# Patient Record
Sex: Female | Born: 1960 | Race: White | Hispanic: No | Marital: Single | Smoking: Former smoker
Health system: Southern US, Community
[De-identification: ages and names within clinical notes are randomized; demographics above are authoritative.]

## PROBLEM LIST (undated history)

## (undated) DIAGNOSIS — S069X9A Unspecified intracranial injury with loss of consciousness of unspecified duration, initial encounter: Secondary | ICD-10-CM

## (undated) DIAGNOSIS — F32A Depression, unspecified: Secondary | ICD-10-CM

## (undated) DIAGNOSIS — R87619 Unspecified abnormal cytological findings in specimens from cervix uteri: Secondary | ICD-10-CM

## (undated) DIAGNOSIS — E079 Disorder of thyroid, unspecified: Secondary | ICD-10-CM

## (undated) DIAGNOSIS — F329 Major depressive disorder, single episode, unspecified: Secondary | ICD-10-CM

## (undated) HISTORY — DX: Depression, unspecified: F32.A

## (undated) HISTORY — DX: Disorder of thyroid, unspecified: E07.9

## (undated) HISTORY — PX: DILATION AND CURETTAGE OF UTERUS: SHX78

## (undated) HISTORY — PX: HERNIA REPAIR: SHX51

## (undated) HISTORY — PX: AUGMENTATION MAMMAPLASTY: SUR837

## (undated) HISTORY — PX: CERVICAL BIOPSY  W/ LOOP ELECTRODE EXCISION: SUR135

## (undated) HISTORY — DX: Unspecified intracranial injury with loss of consciousness of unspecified duration, initial encounter: S06.9X9A

## (undated) HISTORY — DX: Unspecified abnormal cytological findings in specimens from cervix uteri: R87.619

## (undated) HISTORY — DX: Major depressive disorder, single episode, unspecified: F32.9

---

## 2003-05-11 ENCOUNTER — Other Ambulatory Visit: Admission: RE | Admit: 2003-05-11 | Discharge: 2003-05-11 | Payer: Self-pay | Admitting: Obstetrics and Gynecology

## 2003-06-08 ENCOUNTER — Other Ambulatory Visit: Admission: RE | Admit: 2003-06-08 | Discharge: 2003-06-08 | Payer: Self-pay | Admitting: Obstetrics and Gynecology

## 2003-10-21 HISTORY — PX: CERVICAL BIOPSY  W/ LOOP ELECTRODE EXCISION: SUR135

## 2003-10-25 ENCOUNTER — Other Ambulatory Visit: Admission: RE | Admit: 2003-10-25 | Discharge: 2003-10-25 | Payer: Self-pay | Admitting: Obstetrics and Gynecology

## 2004-02-07 ENCOUNTER — Other Ambulatory Visit: Admission: RE | Admit: 2004-02-07 | Discharge: 2004-02-07 | Payer: Self-pay | Admitting: *Deleted

## 2004-05-23 ENCOUNTER — Other Ambulatory Visit: Admission: RE | Admit: 2004-05-23 | Discharge: 2004-05-23 | Payer: Self-pay | Admitting: Obstetrics and Gynecology

## 2004-12-09 ENCOUNTER — Other Ambulatory Visit: Admission: RE | Admit: 2004-12-09 | Discharge: 2004-12-09 | Payer: Self-pay | Admitting: Obstetrics and Gynecology

## 2005-07-04 ENCOUNTER — Other Ambulatory Visit: Admission: RE | Admit: 2005-07-04 | Discharge: 2005-07-04 | Payer: Self-pay | Admitting: *Deleted

## 2005-07-31 ENCOUNTER — Other Ambulatory Visit: Admission: RE | Admit: 2005-07-31 | Discharge: 2005-07-31 | Payer: Self-pay | Admitting: Obstetrics and Gynecology

## 2005-10-20 DIAGNOSIS — S069XAA Unspecified intracranial injury with loss of consciousness status unknown, initial encounter: Secondary | ICD-10-CM

## 2005-10-20 DIAGNOSIS — S069X9A Unspecified intracranial injury with loss of consciousness of unspecified duration, initial encounter: Secondary | ICD-10-CM

## 2005-10-20 HISTORY — DX: Unspecified intracranial injury with loss of consciousness of unspecified duration, initial encounter: S06.9X9A

## 2005-10-20 HISTORY — DX: Unspecified intracranial injury with loss of consciousness status unknown, initial encounter: S06.9XAA

## 2006-03-25 ENCOUNTER — Ambulatory Visit: Payer: Self-pay | Admitting: Pulmonary Disease

## 2006-03-25 ENCOUNTER — Inpatient Hospital Stay (HOSPITAL_COMMUNITY): Admission: EM | Admit: 2006-03-25 | Discharge: 2006-04-08 | Payer: Self-pay | Admitting: Emergency Medicine

## 2006-04-08 ENCOUNTER — Inpatient Hospital Stay (HOSPITAL_COMMUNITY): Admission: AD | Admit: 2006-04-08 | Discharge: 2006-04-11 | Payer: Self-pay | Admitting: Psychiatry

## 2006-04-09 ENCOUNTER — Ambulatory Visit: Payer: Self-pay | Admitting: Psychiatry

## 2006-04-28 ENCOUNTER — Encounter: Admission: RE | Admit: 2006-04-28 | Discharge: 2006-07-27 | Payer: Self-pay | Admitting: Internal Medicine

## 2006-08-28 ENCOUNTER — Other Ambulatory Visit: Admission: RE | Admit: 2006-08-28 | Discharge: 2006-08-28 | Payer: Self-pay | Admitting: Obstetrics & Gynecology

## 2006-09-21 IMAGING — CT CT HEAD W/O CM
1 of 2 series · 13 of 30 positions shown, 17 images · non-contrast
Comparison: None.

CLINICAL DATA: 44 year old female with respiratory failure.
 HEAD CT WITHOUT CONTRAST ? 03/26/06:
TECHNIQUE: Contiguous axial CT images were obtained from the base of the skull through the vertex according to standard protocol without contrast.

[Series 2: brain · axial · 0.49mm/px · z∈[+101,+219]mm · 13 of 28 slices shown, 17 images]
[im 2/28  brain]
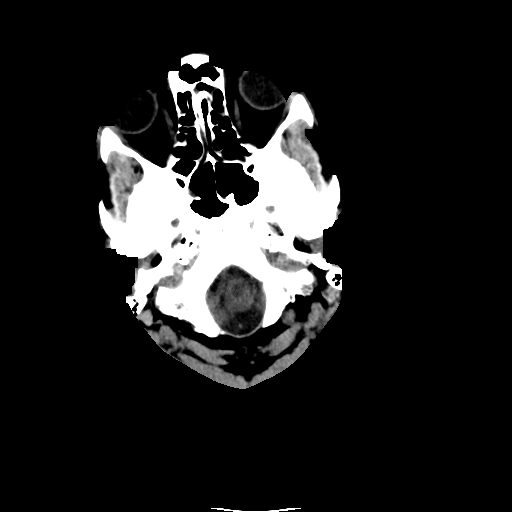
[im 2/28  bone]
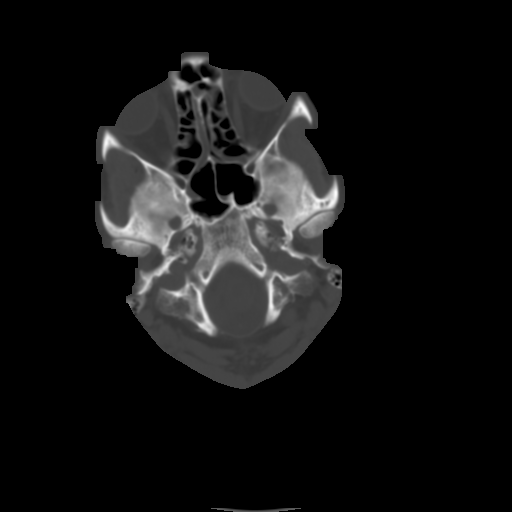
[im 4/28  brain]
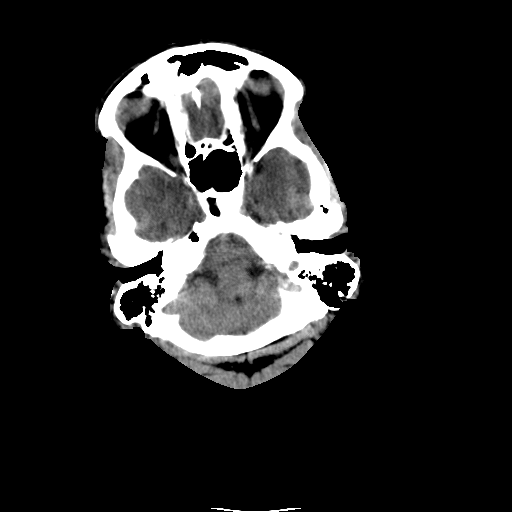
[im 6/28  brain]
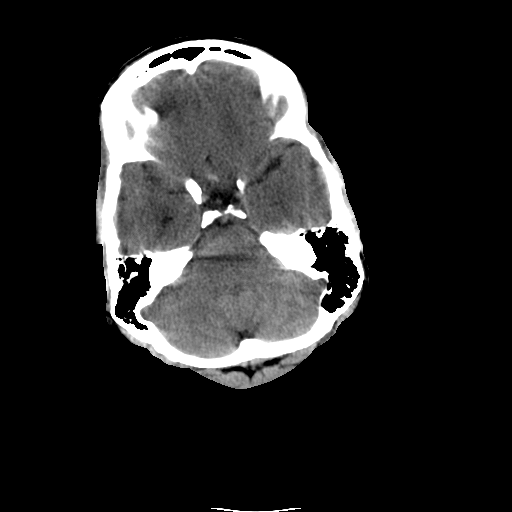
[im 8/28  brain]
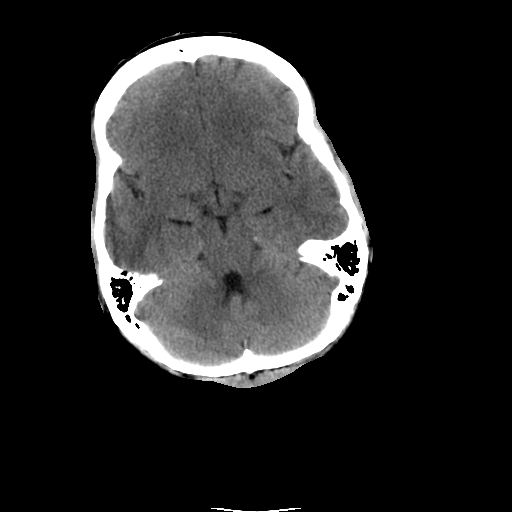
[im 10/28  brain]
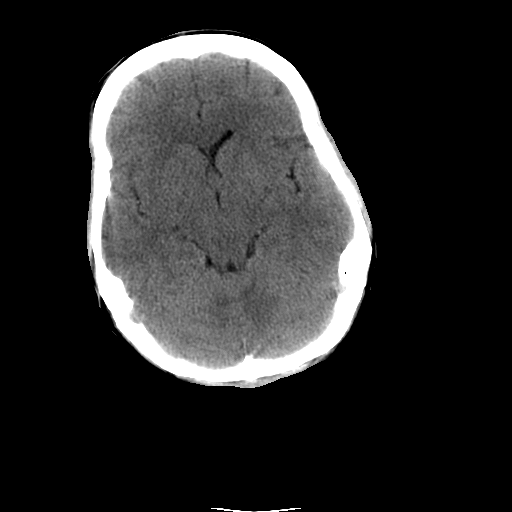
[im 10/28  bone]
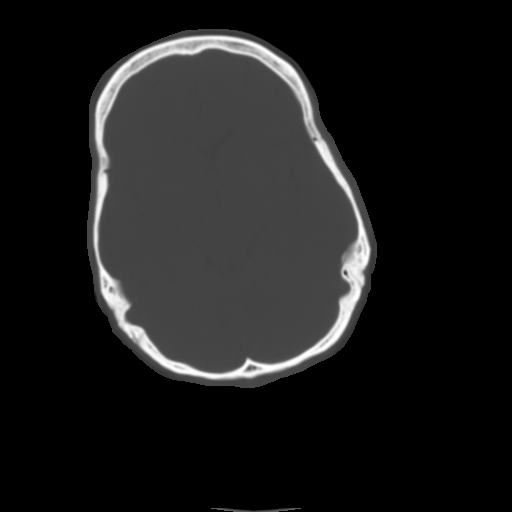
[im 12/28  brain]
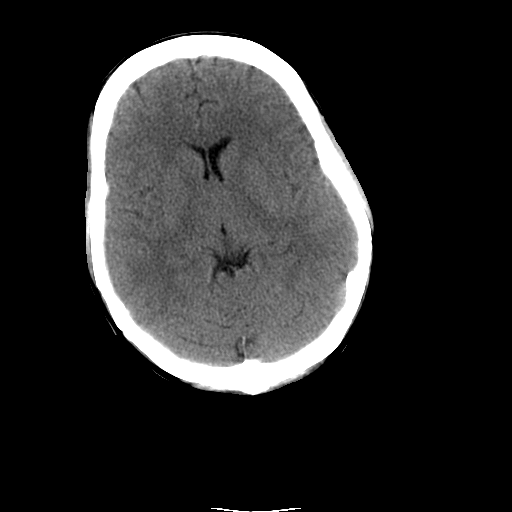
[im 14/28  brain]
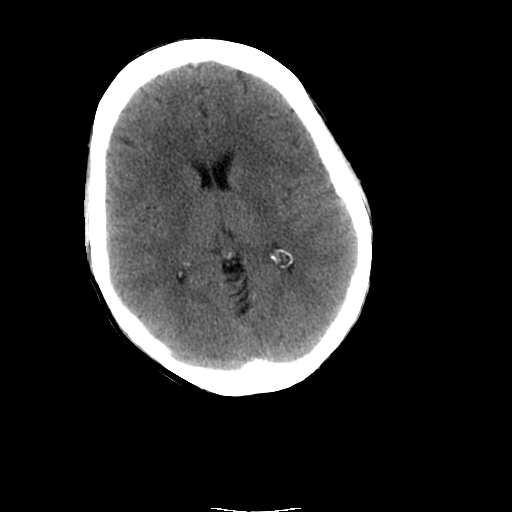
[im 16/28  brain]
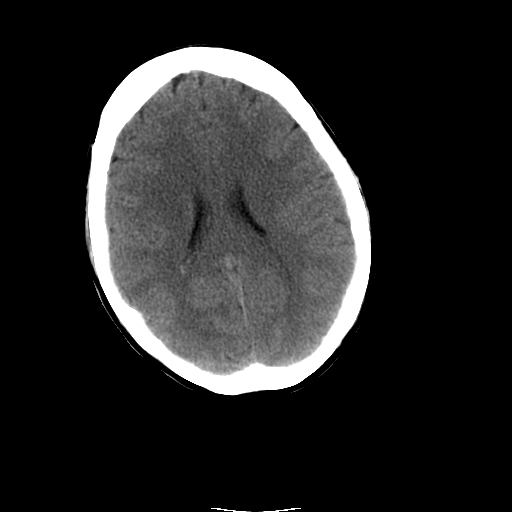
[im 18/28  brain]
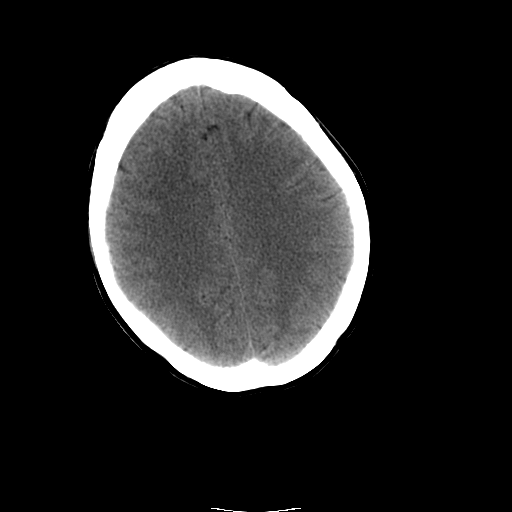
[im 18/28  bone]
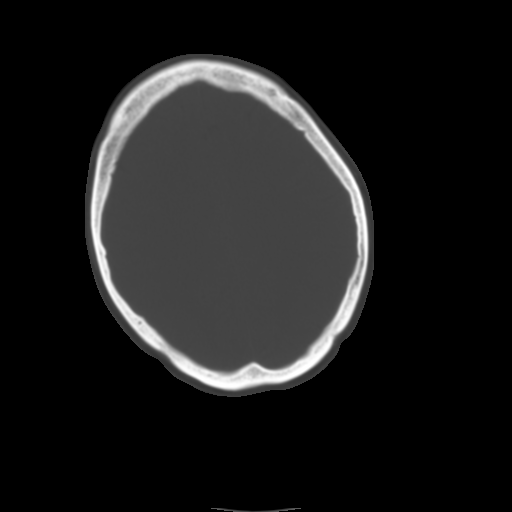
[im 20/28  brain]
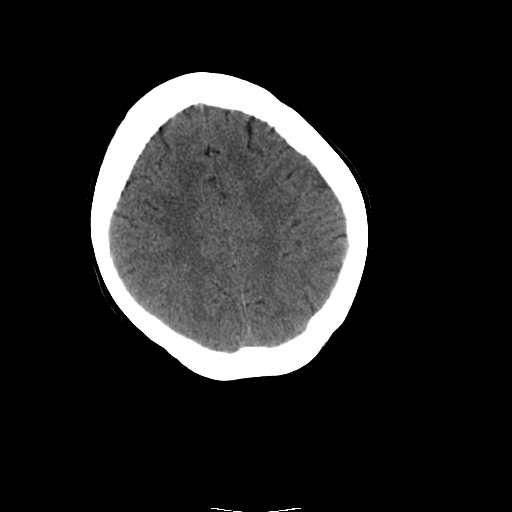
[im 22/28  brain]
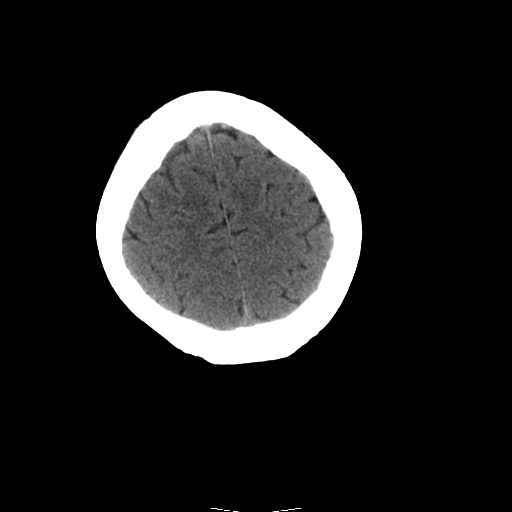
[im 24/28  brain]
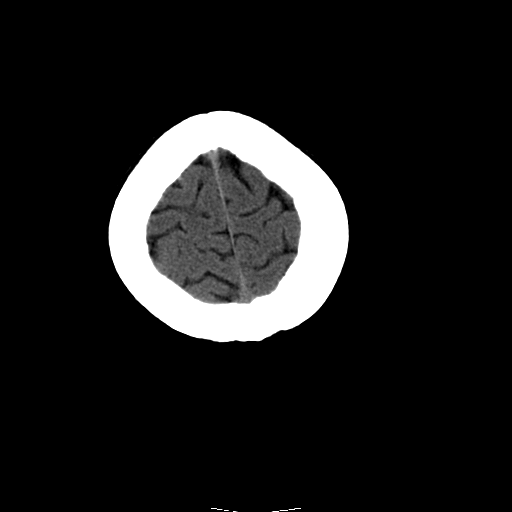
[im 26/28  brain]
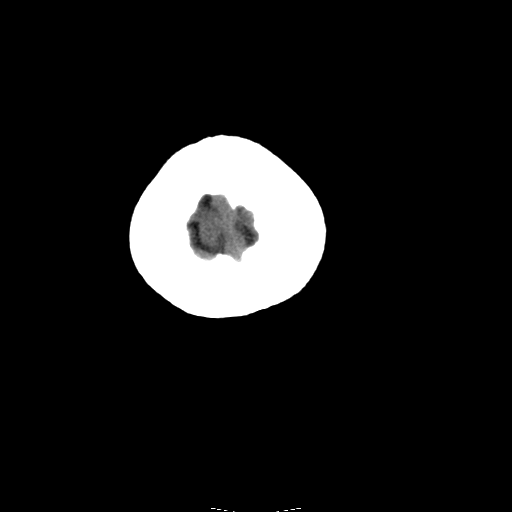
[im 26/28  bone]
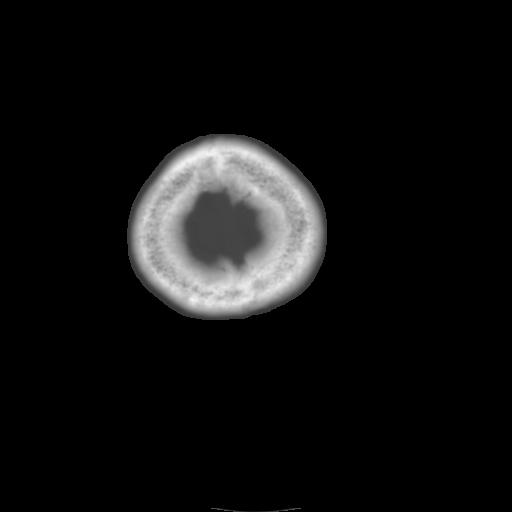

[13 of 30 positions shown; findings below may reference images not displayed]

FINDINGS: No acute intracranial abnormality is present.  Specifically, there is no evidence for acute infarct, hemorrhage, mass, hydrocephalus, or extra-axial fluid collection.  A small air-fluid level is noted within the left sphenoid sinus.  There is mucosal thickening throughout scattered ethmoid air cells.  There is an osteoma within the inferior aspect of the right frontal sinus.  The maxillary sinuses are not well visualized.  The mastoid air cells are clear.  The osseous skull is intact.
IMPRESSION: 1.  No acute intracranial abnormality. 
 2.  Minimal sinus disease.

## 2006-10-02 IMAGING — CR DG CHEST 1V PORT
1 series · 1 of 1 positions shown · non-contrast
Comparison: 04/02/06.
 PORTABLE CHEST - 1 VIEW:

CLINICAL DATA: Respiratory failure.  Overdose.

[view not recorded]
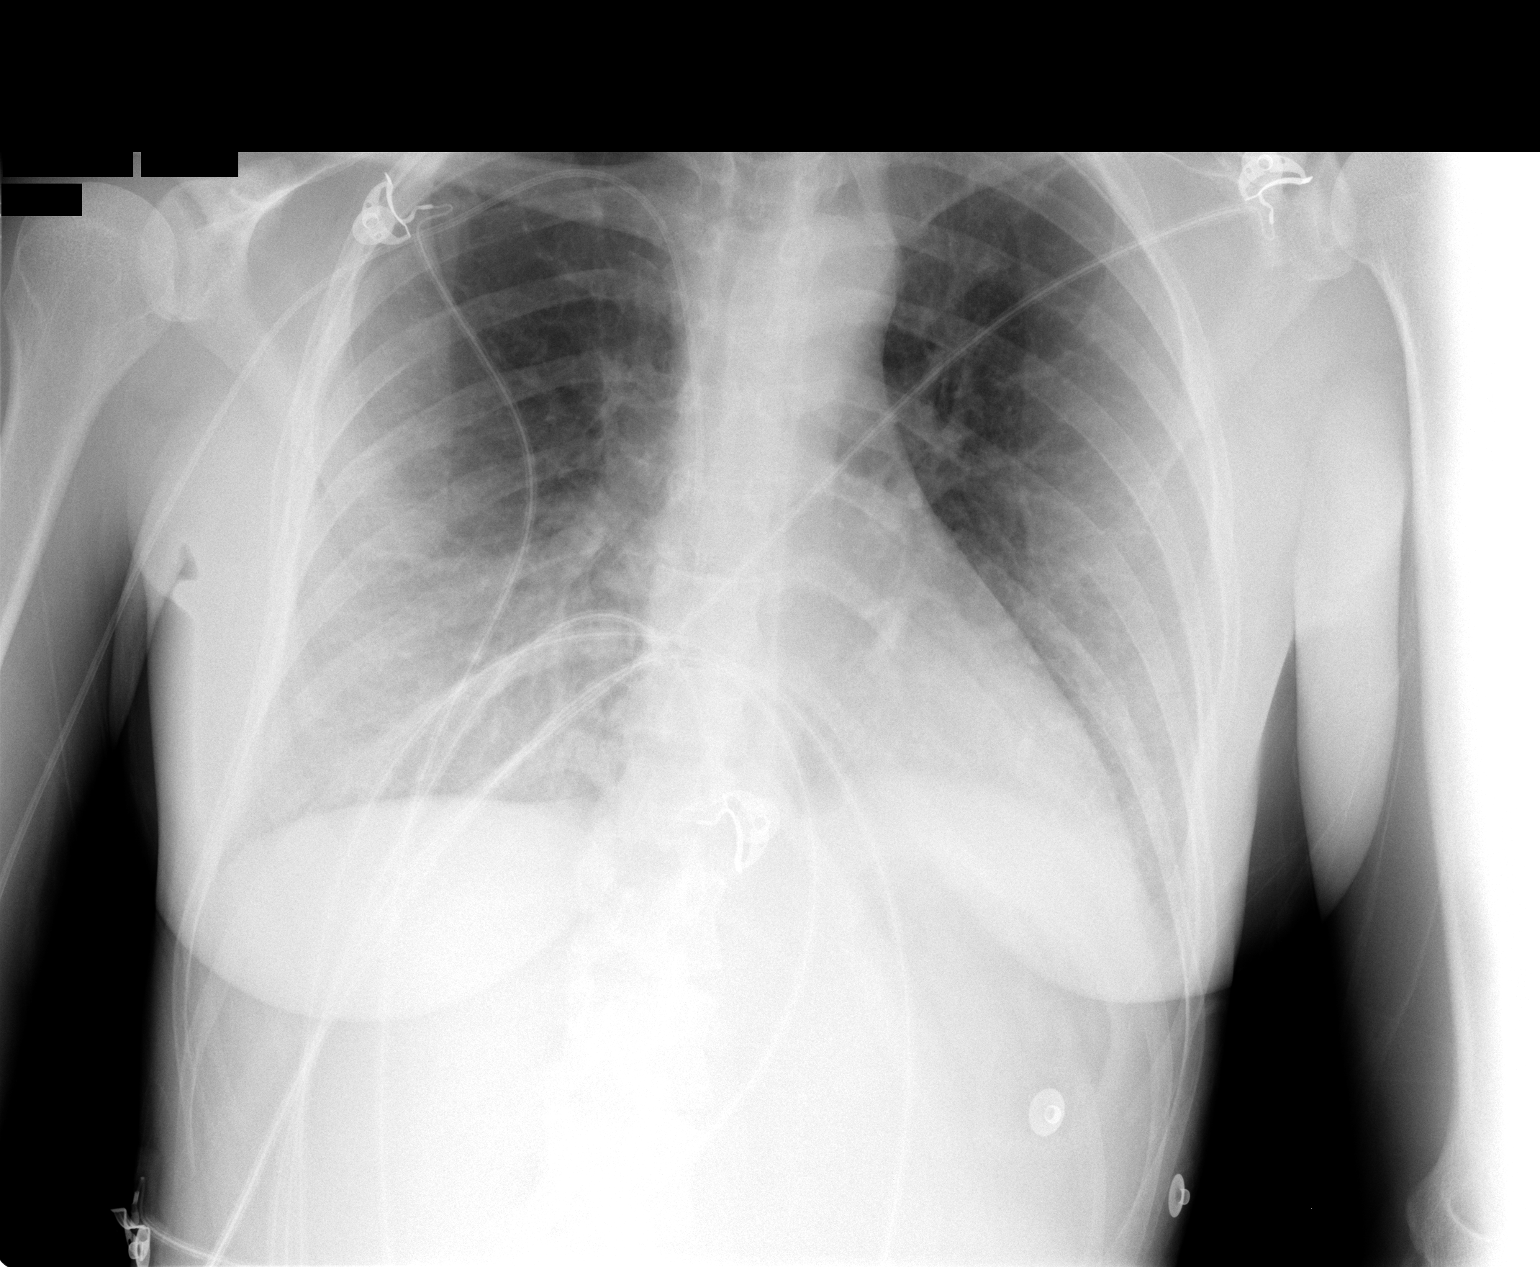

[1 of 1 positions shown; findings below may reference images not displayed]

FINDINGS: Trachea midline.  Heart size is within normal limits.  There has been interval improvement in pulmonary edema.  Left lower lobe atelectasis is improving as well.  Right PICC terminates in the SVC.
IMPRESSION: Improving pulmonary edema and left lower lobe atelectasis.

## 2007-09-09 ENCOUNTER — Other Ambulatory Visit: Admission: RE | Admit: 2007-09-09 | Discharge: 2007-09-09 | Payer: Self-pay | Admitting: Obstetrics & Gynecology

## 2008-09-04 ENCOUNTER — Encounter
Admission: RE | Admit: 2008-09-04 | Discharge: 2008-10-03 | Payer: Self-pay | Admitting: Physical Medicine & Rehabilitation

## 2008-09-05 ENCOUNTER — Ambulatory Visit: Payer: Self-pay | Admitting: Physical Medicine & Rehabilitation

## 2008-09-18 ENCOUNTER — Other Ambulatory Visit: Admission: RE | Admit: 2008-09-18 | Discharge: 2008-09-18 | Payer: Self-pay | Admitting: Obstetrics & Gynecology

## 2008-10-03 ENCOUNTER — Ambulatory Visit: Payer: Self-pay | Admitting: Physical Medicine & Rehabilitation

## 2008-10-05 ENCOUNTER — Encounter
Admission: RE | Admit: 2008-10-05 | Discharge: 2008-10-19 | Payer: Self-pay | Admitting: Physical Medicine & Rehabilitation

## 2009-01-10 ENCOUNTER — Encounter
Admission: RE | Admit: 2009-01-10 | Discharge: 2009-01-18 | Payer: Self-pay | Admitting: Physical Medicine & Rehabilitation

## 2009-01-12 ENCOUNTER — Ambulatory Visit: Payer: Self-pay | Admitting: Physical Medicine & Rehabilitation

## 2010-11-10 ENCOUNTER — Encounter: Payer: Self-pay | Admitting: Obstetrics and Gynecology

## 2011-03-04 NOTE — Assessment & Plan Note (Signed)
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  We initiated trial  of low-dose Ritalin at 2.5 daily at 7 o'clock and 12 noon.  She felt  that the Ritalin was very helpful with her arousal and attention, but  she had problem sleeping.  She never tried this to an a.m. dose.  She  has speech therapy scheduled to start this week.  [facility] went [facility] week and spoke with the speech therapist as  well there.  She denies pain today.  No other neurological changes are  noted.   REVIEW OF SYSTEMS:  As noted above.  Full review is written out in the  history section in the chart.   SOCIAL HISTORY:  The patient is single, living with her dog.  She has a  court case pending over supervision issues regarding her children who  are 27 and 15.   PHYSICAL EXAMINATION:  VITAL SIGNS:  Blood pressure is 120/64, pulse is  78, and respiratory rate 18.  She is saturating 100% on room air.  GENERAL:  The patient is pleasant, alert, and oriented x3.  Affect is  bright and appropriate.  The speech remains nasal but overall, she is  intelligible.  Her strength is 5/5 with normal reflexes and sensory  function.  She can be a bit anxious at times but overall, is  appropriate.  Insight and awareness appear reasonable.  She had good  executive functioning still.  Cranial nerve exam is nonfocal.  HEART:  Regular.  CHEST:  Clear.  ABDOMEN:  Soft, nontender.   ASSESSMENT:  1. Toxic encephalopathy.  2. History of depression/suicide attempts.   PLAN:  1. The patient to continue Namenda per Dr. Sandria Manly.  2. We will give her a trial of Aricept 5 mg at bedtime to improve      memory.  She would like to give this a trial.  It had a lot of      success in memory with brain injury population.  So I think it is      worth a shot.  3. We will retrial Ritalin at 2.5 mg in the morning only.  This should      not cause any nighttime hyperarousal.  4. Speech followup is arranged and pending.  5.  I will see her back in 2 months' time.      Ranelle Oyster, M.D.  Electronically Signed     ZTS/MedQ  D:  10/03/2008 10:50:36  T:  10/04/2008 05:07:56  Job #:  [id number]   cc:   Evie Lacks, MD  Fax: [phone]

## 2011-03-04 NOTE — Assessment & Plan Note (Signed)
This is a brief visit for [name] regarding her toxic  encephalopathy.  She is not taking Aricept or Ritalin anymore at this  point.  She wants to stay clear of medications and stay more natural  with her remedies.  She has had no further issues since I last saw her.  She denies pain.  She is sleeping well.  She has been through some  speech therapy and has been active in the [facility].   REVIEW OF SYSTEMS:  Notable for the above.  Full review is in the  written health and history section of the chart.   SOCIAL HISTORY:  The patient lives alone and is divorced.   PHYSICAL EXAMINATION:  Blood pressure is 111/71, pulse is 66,  respiratory rate 18.  She is sating 100% on room air.  The patient is  generally pleasant, alert and oriented x3.  Gait is stable.  Speech is  intelligible.  Strength is 5/5.  Heart is regular rate.  Chest is clear.   ASSESSMENT:  1. Toxic encephalopathy.  2. History of depression/suicide attempts.   PLAN:  I will discharge the patient from this clinic as I have nothing  really new to offer.  Overall, she is doing quite well considering the  depth and level of her injury.  She can come back to see me at anytime  if she should choose for followup.  She will see Dr. Sandria Manly as needed as  well.  Encouraged involvement [facility] which she seems  to have taken to.      Ranelle Oyster, M.D.  Electronically Signed     ZTS/MedQ  D:  01/12/2009 09:39:37  T:  01/12/2009 23:43:36  Job #:  [id number]   cc:   Genene Churn. Love, M.D.  Fax: [phone]

## 2011-03-07 NOTE — Consult Note (Signed)
[name], [name]             ACCOUNT NO.:  [id number]   MEDICAL RECORD NO.:  [id number]          PATIENT TYPE:  INP   LOCATION:  6729                         FACILITY:  [facility]   PHYSICIAN:  Antonietta Breach, M.D.  DATE OF BIRTH:  July 02, 1961   DATE OF CONSULTATION:  04/02/2006  DATE OF DISCHARGE:  04/08/2006                                   CONSULTATION   PSYCHIATRY CONSULTATION ADDENDUM   DATE OF CONSULTATION:  April 02, 2006.   REASON FOR CONSULTATION:  Severe mood disturbance and possible serotonin  syndrome.   PHYSICAL EXAMINATION:  MENTAL STATUS EXAMINATION:  [name] is lying  supine in her hospital bed.  Her affect is flat.  Her thought process is  disorganized.  She is clearly confused and disoriented.  She does not appear  to have intact judgment or insight.  She is stuporous with poor  concentration.      Antonietta Breach, M.D.  Electronically Signed     JW/MEDQ  D:  04/09/2006  T:  04/09/2006  Job:  [id number]

## 2011-03-07 NOTE — Discharge Summary (Signed)
[name], [name]             ACCOUNT NO.:  [id number]   MEDICAL RECORD NO.:  [id number]          PATIENT TYPE:  IPS   LOCATION:  0305                          FACILITY[facility]  [facility]   PHYSICIAN:  Geoffery Lyons, M.D.      DATE OF BIRTH:  11-19-1960   DATE OF ADMISSION:  04/08/2006  DATE OF DISCHARGE:  04/11/2006                                 DISCHARGE SUMMARY   CHIEF COMPLAINT AND PRESENTING ILLNESS:  This was the first admission to  [facility]  for this 50 year old divorced white female who  was voluntarily admitted on June 20 when she was transferred from [facility]  medical unit.  She had overdosed on several medications requiring intubation  twice.  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  She was treated accordingly.  She  recovered from the overdose, transferred to [facility] to further treat her depression as well as her alcohol abuse,  possibly dependence.  She endorsed that she had been under a lot stress,  broke up with a boyfriend, ex-husband was giving her a hard time in terms of  taking full custody of the children.  To make things worse, brother died  secondary to AIDS.  Endorsed that she could not take this anymore.  She has  been increasingly drinking more alcohol, was drinking a bottle of wine,  maybe more, per day.  On the day of the overdose she was already  intoxicated, felt completely overwhelmed, sense of hopelessness,  helplessness, and she started taking medication, eventually took a full  overdose.  Her son walked into the house when she was toxic.   PAST PSYCHIATRIC HISTORY:  First time at [facility], no  present treatment.  Being treated with Paxil for depression.   PAST MEDICAL HISTORY:  As already stated, status post overdose requiring  intubation.  Hypothyroidism.   ALCOHOL AND DRUG HISTORY:  Did admit to increased use of alcohol, up to a  bottle of wine per day.   MEDICATIONS:  1.  Synthroid 200 mcg per day.  2.  Paxil has been taking for years.   PHYSICAL EXAMINATION:  Performed at the medical unit.  Upon re-evaluation  she was experiencing some difficulty in enunciation, some dysarthria.  Otherwise physical examination was within normal limits.   MENTAL STATUS EXAM:  Reveals an alert, cooperative female, fair eye contact.  Speech: Some dysarthria but normal rate, tempo and production.  Mood  depressed, affect depressed.  Thought processes logical coherent and  relevant, starting to remember some of the events around the overdose and  not completely aware of the severity of what she went through.  Some  confusion in terms of particular times and dates and sequence of events, but  no evidence of delusions or hallucinations. Cognition:  Hard time focusing,  some difficulty with recent memory, some difficulty remembering specifics  around and after the overdose.  No suicidal or homicidal ideas, no  hallucinations.   ADMISSION DIAGNOSES:  AXIS I:  Major depression, recurrent, alcohol abuse,  rule out dependence.  AXIS II:  No diagnosis.  AXIS III:  Hypothyroidism, status post poly pharmacy overdose, UTI serotonin  syndrome.  AXIS IV:  Moderate.  AXIS V:  Upon admission 45, highest global assessment of functioning in the  last year 75-80.   COURSE IN HOSPITAL:  She was admitted and started in individual and group  psychotherapy.  She was maintained on the Synthroid.  She was also placed on  Protonix 40 mg per day and we provided some Librium as needed for  possibility of any withdrawal.  Lab work on June 19: Urinalysis leukocytes  many, nitrate moderate, white blood cells 11-20.  June 17 white blood cells  12.7, hemoglobin 11.6.  Blood chemistries: Sodium 137, potassium 3.7,  glucose 108.  Liver enzymes: SGOT 26, SGPT 59.  As otherwise stated,  she  endorsed that she was under a lot of stress with all the events already  identified, did admit to depending more on alcohol to cope.  She seemed to  be minimizing the stressors, the events, her use.  Endorsed that she wanted  to get out of the hospital and move on with her life.  Initially some  ambivalence as far as her being alive, but eventually she endorsed that she  was happy and she was given another chance.  Did endorse a long history of  depression, on Paxil for years, claiming not responding as well lately.  We  continued to work to stabilize.  As the hospitalization progressed she  seemed to be more aware of the events that had been before she was admitted.  No evidence of the confusion that she had evidenced upon admission, more  upfront about her use of alcohol.  Not opposed to pursuing some sort of  residential treatment.  By June 22 she had continued to improve cognitively  wise.  Endorsed that she understood that she must abstain from drinking  alcohol as these last episodes set the stage for the overdose.  Also  endorsed that she needed to learn of ways of coping with the stress in her  life.  We pursued stabilization, we pursued further relapse prevention.  On  June 23 there was a family session where they discussed the possibility of  Sophira going to [facility].  At that particular time she was in full contact  with reality, still some dysarthric speech but insightful, no cognitive  impairment, no suicidal or homicidal ideas, no hallucinations, no delusions.  She felt that she was ready to go.  The family wanted her discharged and  pursue admission to [facility] residential treatment program.  As she was in  full contact with reality, she was fully detoxed, cognitively improved, we  went ahead and discharged to outpatient followup.   DISCHARGE DIAGNOSES:  AXIS I:  Major depression, recurrent, alcohol  dependence.  AXIS II:  No diagnosis. AXIS III:  Hypothyroidism,  status post overdose, urinary tract infection.  AXIS IV:  Moderate.  AXIS V:  Upon discharge 50-55.   DISCHARGE MEDICATIONS:  1.  Protonix 40 mg per day.  2.  Synthroid 200 mcg per day.   DISPOSITION:  To be followed up through a residential treatment program,  most possibly [facility]  , with referral to an outpatient therapist and  psychiatrist when she completes her residential treatment.      Geoffery Lyons, M.D.  Electronically Signed     IL/MEDQ  D:  04/13/2006  T:  04/13/2006  Job:  [phone]

## 2011-03-07 NOTE — Discharge Summary (Signed)
[name], [name]                ACCOUNT NO.:  [id number]   MEDICAL RECORD NO.:  [id number]          PATIENT TYPE:  INP   LOCATION:  2919                         FACILITY:  [facility]   PHYSICIAN:  Lonia Blood, M.D.DATE OF BIRTH:  Feb 12, 1961   DATE OF ADMISSION:  03/25/2006  DATE OF DISCHARGE:                                 DISCHARGE SUMMARY   PRIMARY CARE PHYSICIAN:  Unassigned.   DISCHARGE DIAGNOSES:  1.  Multidrug overdose.  2.  Ventilator dependent respiratory failure/acute lung injury.      1.  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      2.  Left lower lobe aspiration pneumonia.      3.  Sepsis/shock secondary to pneumonia.  3.  Serotonin syndrome.  4.  Depression with prior history of suicide attempt.  5.  Hypothyroidism.  6.  Toxic metabolic encephalopathy.  7.  Urinary tract infection.   DISCHARGE MEDICATIONS:  Discharge medications are to be determined on the  actual date of discharge.   CONSULTATIONS:  1.  Pulmonary, critical care.  2.  Psychiatry.   HISTORY OF PRESENT ILLNESS:  [name] is a 50 year old female who  was admitted to the hospital on March 25, 2006 after being found at home by  her son in a very confused state.  The son was alert enough to call 911.  The patient was known to be suffering with severe depression after having  broken up with a long term boyfriend.  She was known to have been drinking  and is thought to have consumed alprazolam, Tylenol PM and Topamax amongst  the possibility of multiple others.  Upon arrival to the Emergency Room ,  she was found to be in significant respiratory distress and had to be  emergently intubated.   HOSPITAL COURSE:  [name] was originally admitted to the [facility]  hospitalist service.  It became clear that she was in the Emergency Room ,  she required emergent intubation and mechanical ventilation.  As a result,  decision was made if the  patient should be transferred to the critical care  service.  Initial portion of the hospitalization was attended to by the  critical care service.  During this time, the patient required ongoing  ventilator support.  She was dosed empirically with Mucomyst for a short  course because of elevated LFTs and the question of Tylenol intake.  Ultimately, however, it was decided that a full course of Mucomyst was not  indicated at the time as the levels of Tylenol toxicity and nomogram were  not within the toxic range.  Tube feeds were administered and the patient  was supported with the usual DVT prophylaxis and aspiration precautions.  During the initial portion of the patient's intensive care stay, she  suffered an episode marked by myoclonic seizures and a temperature of 107.  Seizures would alternate with episodes of catatonia.  There was concern the  patient was suffering with neurolytic malignant syndrome versus a serotonin  syndrome.  Paralytics were required  as was a Versed drip.  Supraheptadine  was administered.  Aggressive external cooling was applied.  The patient,  fortunately, did clear from her acute episode.  Fortunately, the patient was  able to be stabilized.  She ultimately progressed to extubation.  Shortly  after extubation, however, the patient began to exhibit signs of worsening  respiratory distress.  The patient required a reintubation.  Further  evaluation suggested the possibility of acute lung injury versus an  aspiration.  Sputum cultures revealed an H. Flu.  It was felt the patient  suffered a left lower lobe aspiration and was possibly suffering with acute  lung injury/BOOP concomitantly.  Empiric broad spectrum antibiotics were  administered and high dose steroids were administered as well.  Fortunately,  with this intervention, the patient improved nicely.  On April 02, 2006, she  was able to be extubated successfully.  Chest x-ray revealed decreasing  infiltrate  on the right and improved aeration on the left.  Once liberated  from the ventilator, internal hospitalists were reconsulted to consider  assuming primary care.  Psychiatry was also consulted and agreed to continue  to follow the patient to assist with ultimate psychiatric disposition.  Speech pathology evaluation was carried out and the patient was cleared for  a modified diet.   The outstanding issue at this time is [name]'s toxic metabolic  encephalopathy.  She remains severely confused.  She is alert, but is also  quite agitated.  She does not know the time, the date, the place or the  situation.  She does recognize family members.  There are no focal  neurologic deficits to suggest a localizing CNS lesion.  We are rapidly  tapering steroids.  We are attempting to avoid sedatives as much as  possible, though they have been required on occasion because the patient's  severely combative behavior.  We will continue to watch the patient and hope  that her TME clears.  If she fails to show any signs of significant  improvement over the next 48 hours, we will consider heavy sedation with a  CT scan of the head for reevaluation.  Should the patient clear form her  toxic metabolic encephalopathy, then an ultimate psychiatric disposition for  her severe depression with suicidal attempt will be required.      Lonia Blood, M.D.  Electronically Signed     JTM/MEDQ  D:  04/04/2006  T:  04/04/2006  Job:  [id number]

## 2011-03-07 NOTE — Discharge Summary (Signed)
[name], [name]             ACCOUNT NO.:  [id number]   MEDICAL RECORD NO.:  [id number]          PATIENT TYPE:  INP   LOCATION:  6729                         FACILITY:  [facility]   PHYSICIAN:  Elliot Cousin, M.D.    DATE OF BIRTH:  April 05, 1961   DATE OF ADMISSION:  03/25/2006  DATE OF DISCHARGE:  04/08/2006                                 DISCHARGE SUMMARY   ADDENDUM:  PLEASE SEE THE PREVIOUS DISCHARGE SUMMARY DICTATED BY DR. JEFFREY MCCLUNG ON  JUNE 6 TO April 04, 2006.   HOSPITAL COURSE:  #1 - METABOLIC ENCEPHALOPATHY WITH RESIDUAL MILD  DYSARTHRIA AND DYSPHAGIA:  Over the remainder of the hospital course the  patient's agitation and disorientation completely resolved.  She became much  more alert and oriented and cooperative.  She, however, did demonstrate some  dysarthria and mild dysphagia.  The speech therapist was assisting the  patient with swallowing and with speech.  The patient was tolerating a  regular diet; however, she did have some difficulty with thin liquids.  Given the patient's clinical course and reason for admission, she did  demonstrate some difficulty with long-term memory.  However, as the days  went on, she began to remember certain elements that led to her admission.  Her cognition overall was much improved.  The delirium was completely  resolved.  She was maintained on vitamin therapy with a multivitamin with  iron.  Her diet at the time of hospital discharge consisted of a regular  diet with nectar thickened liquids.   #2 - URINARY TRACT INFECTION:  The patient complained of dysuria during the  hospital course.  An urinalysis was ordered and revealed bacteria and white  blood cells.  An urine culture, unfortunately, was not obtained.  However,  the patient was started on ciprofloxacin.  She completed a three-day course.  At the time of hospital discharge she had no complaints of dysuria.   #3 - HYPOTHYROIDISM:  The patient was maintained on Synthroid  200 mcg daily.  A follow-up TSH was ordered and was slightly elevated at 7.124.  A free T4  therefore was ordered.  The free T4 was therapeutic at 1.62.  The patient  was therefore maintained on the same dose of Synthroid.   #4 - DEPRESSION WITH SUICIDE ATTEMPT:  Please see the previous discharge  summary dictated by Dr. Sharon Seller.  Dr. Jeanie Sewer, psychiatrist, followed the  patient throughout the hospitalization.  Prior to hospital discharge he  recommended inpatient treatment for ongoing psychiatric care and management.  [facility] accepted the patient for ongoing treatment.  The  disposition was discussed with the patient and the patient's mother.  They  both were in agreement that the patient needed ongoing help.  The patient  was quite receptive to receiving inpatient psychiatric treatment.  Because  of her underlying encephalopathy, the patient's mother has decided to stay  and assist the patient in her home following discharge from [facility].   DISCHARGE MEDICATIONS:  1.  Multivitamin with iron.  2.  Synthroid 200 mcg daily.  3.  Protonix 40 mg daily.  4.  Tylenol 325 mg one to two tablets every four to six hours as needed for      pain and fever.  5.  Prednisone 10 mg daily until June22.   DISCHARGE LABORATORIES:  Sodium 138, potassium 4.1, chloride 104, CO2 28,  glucose 103, BUN 21, creatinine 0.9, total bilirubin 0.4, alkaline  phosphatase 71, SGOT 20, SGPT 33, total protein 6.8, albumin 3.7, calcium  9.5.  WBC 9.9, hemoglobin 12.1, platelets 785.  Free T4 1.62.  Magnesium 2.      Elliot Cousin, M.D.  Electronically Signed     DF/MEDQ  D:  04/13/2006  T:  04/13/2006  Job:  [id number]

## 2011-03-07 NOTE — H&P (Signed)
[name], WICKERSHAM NO.:  [id number]   MEDICAL RECORD NO.:  [id number]          PATIENT TYPE:  INP   LOCATION:  1824                         FACILITY:  [facility]   PHYSICIAN:  Isidor Holts, M.D.  DATE OF BIRTH:  1960/10/23   DATE OF ADMISSION:  03/25/2006  DATE OF DISCHARGE:                                HISTORY & PHYSICAL   PRIMARY MEDICAL DOCTOR:  Unassigned.   CHIEF COMPLAINT:  Multi-drug overdose.   HISTORY OF PRESENT ILLNESS:  The patient is unresponsive, intubated, and is  unable to supply history.  However, history is obtained from the patient's  neighbors and friends.  According to them, the patient's 49 year old son  came off the bus, after returning from school at about 2:45 on March 25, 2006,  went into the house, found his mother confused, poorly responsive, and  mumbling incoherently.  He ran out of the house, ran across the road and  called a neighbor who came into the house, saw the situation and called 911.  From all accounts, the patient has a history of severe depression and as a  matter of fact, overdosed on drugs approximately 6 years ago.  She is a  divorcee and approximately 10 days ago, broke up with her current boyfriend.  Lately she has been drinking a lot, at least one bottle of wine a day.  One  of her friend states that he gave her an envelope containing 0.5 mg  alprazolam x 5 pills,last night.  This envelope was found beside her on March 25, 2006, empty.  In addition, there was some wine on the counter and there  was an open bottle of Tylenol PM and a Topamax container, which was also  empty.  On arrival in the emergency department the patient was found to be  in significant respiratory distress, and she was emergently intubated.   PAST MEDICAL HISTORY:  1.  Severe depression.  2.  Previous history of drug overdose, approximately 6 years ago.  3.  Hypothyroidism.  Nothing else is known.   MEDICATIONS:  1.  Synthroid.  2.   Flagyl.  3.  Loestrin Fe 1/20.  4.  Claritin.  5.  Paxil.   The dosages of these medications are not known at this time.   ALLERGIES:  NOT KNOWN AT THIS TIME.   SYSTEMS REVIEW:  Unobtainable.   SOCIAL HISTORY:  The patient is divorced, was in a relationship, but broke  up with her boyfriend approximately 10 days ago.  She has two sons aged 10  years and 12 years.  She is said to be a social smoker, but lately has  been drinking up to a bottle of wine daily at least.  No history of drug  abuse.   FAMILY HISTORY:  Unknown at this time.   PHYSICAL EXAMINATION:  VITALS:  Temperature 91.8, pulse 98 per minute  regular, respiratory rate 16, BP 90/54 mm Hg.  Pulse oximeter 100% on 12  liters of oxygen.  GENERAL:  The patient is intubated and ventilated.  Not in acute respiratory  distress otherwise.  HEENT:  No clinical pallor.  No jaundice.  CHEST:  Clinically clear to auscultation.  No wheezes, no crackles.  HEART: Heart sounds 1 and 2 heard, normal, regular, no murmurs.  ABDOMEN:  Flat and soft, bowel sounds are heard, there is no palpable  organomegaly.  LOWER EXTREMITIES:  Lower extremity examination was quite unremarkable.  MUSCULOSKELETAL SYSTEM:  Not formally examined.  CENTRAL NERVOUS SYSTEM:  The patient is unconscious.  Plantars are downgoing  bilaterally.  Pupils are dilated but sluggishly reactive to light.   INVESTIGATIONS:  CBC:  WBC 12.2, hemoglobin 14.0, hematocrit 39.7, platelets  278.  Electrolytes:  Sodium 149, potassium 4.2, chloride 105, CO2 17.8, BUN  12, creatinine 1.0, glucose 93.  Urinalysis was negative.  Urine drug screen  is positive for opiates and benzodiazepines.  Acetaminophen is 17.5.  Alcohol level is less than 5.  Salicylate less than 4.  Urine pregnancy test  is negative.  ABG on vent:  pH 7.394, pCO2 40.7, pO2 286.0, bicarbonate  25.1.  EKG initially showed sinus tachycardia at 101 per minute, right axis  deviation, nonspecific intraventricular  block.  Followup EKG showed sinus  rhythm, left bundle branch block pattern, sinus arrhythmia at 99 per minute.   ASSESSMENT AND PLAN:  1.  Multi-drug overdose: ie, benzodiazepines, Tylenol, antidepressants.  The      patient obviously needs supportive care.  We shall therefore admit to      the intensive care unit, maintain telemetric monitoring, continue with      intravenous fluid hydration, follow electrolytes and respiratory status.   1.  Respiratory failure.  The patient is already intubated in the emergency      department and respiratory status appears quite stable on vent.  We      shall invite pulmonary/critical care to manage the ventilator.  I have      already had a discussion with Dr. Marcelyn Bruins.   1.  Abnormal EKG.  Likely secondary to drug overdose.  The patient now      appears to have a new left bundle branch block pattern.  We shall      therefore cycle cardiac enzymes.   1.  Metabolic acidosis.  The patient has already received three ampules of      bicarbonate in the emergency department and is currently receiving      intravenous infusion of bicarbonate and D5W.  We shall continue the      current intravenous fluid regimen, and follow electrolytes.   1.  History of dysthyroidism.  We shall check TSH.   1.  History of depression/previous suicide attempt.  When the patient is off      ventilator and more alert, she will need one-on-one sitter and      psychiatric evaluation.   1.  Hypotension.  We shall manage with aggressive intravenous fluids, but,      if BP proves unresponsive to this, we may have to consider inotropic      support.   Further management will depend on clinical course.      Isidor Holts, M.D.  Electronically Signed     CO/MEDQ  D:  03/25/2006  T:  03/25/2006  Job:  [id number]

## 2011-03-07 NOTE — Consult Note (Signed)
NAMELOREY, PALLETT NO.:  [id number]   MEDICAL RECORD NO.:  [id number]          PATIENT TYPE:  INP   LOCATION:  2919                         FACILITY:  [facility]   PHYSICIAN:  Antonietta Breach, M.D.  DATE OF BIRTH:  [birth year]   DATE OF CONSULTATION:  04/02/2006  DATE OF DISCHARGE:                                   CONSULTATION   REFERRING PHYSICIAN:  Shan Levans, M.D.   REASON FOR CONSULTATION:  Severe mood disturbance and possible serotonin  syndrome.   HISTORY OF PRESENT ILLNESS:  [name] is a 50 year old female that was  admitted on March 25, 2006, with a multi-drug overdose.  By report, her 69-  year-old son came home from school and found his mother confused and poorly  responsive.  He then went to his neighbor's house, and they called EMS.   Recent stressors appear to be that she broke up with her boyfriend 10 days  ago. She had begun increased drinking with a bottle of wine per day.  It is  likely that she was also taking Xanax with the alcohol.  On the overdose,  she overdosed with Xanax, Tylenol, Topamax according to the bottles that  were found.   The patient had to be intubated.  She also has exhibited myoclonic jerking  and was started on cyproheptadine.  She also has been showing hyperthermia.  Although she is extubated, she continues to remain confused with thought  disorganization.   PAST PSYCHIATRIC HISTORY:  There is a history of depression and history of  prior suicide attempt with an overdose.  It is reported that she was  followed by a local psychiatrist.   FAMILY PSYCHIATRIC HISTORY:  None known.   SOCIAL HISTORY:  Marital: Divorced.  Recently broken up with a boyfriend.  She has 2 children, a 26 year old son and a 72 year old son.  Occupation:  Currently disabled.   The patient has had an increase in the amount of alcohol as described above.  No known illegal drugs.   MEDICATIONS:  The MAR is reviewed.  Psychotropics currently  include Paxil  and Ativan 2 mg q. 4 h p.r.n.   ALLERGIES:  No known drug allergies.   LABORATORY DATA:  Hemoglobin is decreased at 9.4, platelets stable at 181.  INR is 1.3.  AST 40, ALT 83. CK is still elevated at 10,703. TSH is mildly  elevated at 6.9.  Pregnancy test is negative.  Tylenol and aspirin levels  are negative.  Urine drug screen is positive for opiates and  benzodiazepines.  Blood alcohol level was negative on admission.   The head CT is negative except for minimal sinus disease.   REVIEW OF SYSTEMS:  The patient is not able to provide this.  By computer  and chart review, the following Review of Systems is conducted.  CONSTITUTIONAL: Afebrile.  HEENT: There is no report of eye, ears, nose,  mouth, or throat difficulty. CARDIOVASCULAR: Unremarkable. RESPIRATORY: As  above. GASTROINTESTINAL: No diarrhea or vomiting.  GENITOURINARY: No  dysuria. MUSCULOSKELETAL: The patient has exhibited rigidity during her  hyperthermia.  SKIN: Unremarkable.  NEUROLOGIC: Unremarkable other than  above. PSYCHIATRIC: As above. ENDOCRINE: TSH is mildly elevated.  HEMATOLOGIC/LYMPHATIC: Anemia.  No known drug allergies.   PHYSICAL EXAMINATION:  VITAL SIGNS: Temperature 98.6, pulse 70, respirations  20, blood pressure 130/70, O2 saturation 99% on 2 liters.   ASSESSMENT:  AXIS I: Mood disorder not otherwise specified, depressed with  organic factors as well as a history of functional illness.  Delirium, not otherwise specified, rule out alcohol dependence.  AXIS II: Deferred.  AXIS III: Please see the general medical problems above. The patient has  exhibited hyperthermia, rigidity, as well as the type of clonus that you  would likely see in serotonin syndrome versus neuroleptic malignant  syndrome.  AXIS IV: Primary support group, general medical.  AXIS V: 15.   RECOMMENDATIONS:  Concur with supportive measures including the Periactin  for 5HT2 blockade.   Regarding further  psychotropic medication management, this will be assessed  as the patient's delirium clears.  She will likely need a psychiatric  inpatient hospitalization due to the depressive history and suicide attempt,  but this is yet to be determined due to the dominant current delirium state.  Concur with avoiding antipsychotics if possible due to the risk of reducing  the seizure threshold.      Antonietta Breach, M.D.  Electronically Signed     JW/MEDQ  D:  04/05/2006  T:  04/06/2006  Job:  [id number]

## 2014-08-17 ENCOUNTER — Telehealth: Payer: Self-pay | Admitting: *Deleted

## 2018-01-21 ENCOUNTER — Other Ambulatory Visit: Payer: Self-pay

## 2018-01-21 ENCOUNTER — Ambulatory Visit: Payer: BC Managed Care – PPO | Admitting: Certified Nurse Midwife

## 2018-01-21 ENCOUNTER — Encounter: Payer: Self-pay | Admitting: Certified Nurse Midwife

## 2018-01-21 ENCOUNTER — Other Ambulatory Visit (HOSPITAL_COMMUNITY)
Admission: RE | Admit: 2018-01-21 | Discharge: 2018-01-21 | Disposition: A | Discharge status: Home / Self Care | Payer: BC Managed Care – PPO | Source: Ambulatory Visit | Attending: Certified Nurse Midwife | Admitting: Certified Nurse Midwife

## 2018-01-21 VITALS — BP 116/70 | HR 70 | Resp 16 | Ht 66.25 in | Wt 133.0 lb

## 2018-01-21 DIAGNOSIS — Z0000 Encounter for general adult medical examination without abnormal findings: Secondary | ICD-10-CM | POA: Diagnosis not present

## 2018-01-21 DIAGNOSIS — Z124 Encounter for screening for malignant neoplasm of cervix: Secondary | ICD-10-CM | POA: Diagnosis not present

## 2018-01-21 DIAGNOSIS — Z01419 Encounter for gynecological examination (general) (routine) without abnormal findings: Secondary | ICD-10-CM

## 2018-01-21 DIAGNOSIS — N951 Menopausal and female climacteric states: Secondary | ICD-10-CM

## 2018-01-21 NOTE — Progress Notes (Signed)
57 y.o. [id number] Single  Caucasian Fe here to re-establish gyn care and  for annual exam. Menopausal no HRT. No hot flashes or night sweats now. Denies vaginal bleeding or vaginal dryness. Declines mammogram, if needed. Will do thermogram only. Patient prefers non intervention if at all possible when screening. History of traumatic brain injury with suicide attempt and now has good function and avoids x/rays at all cost. History of depression on no medication,but does use CBD oil at times.Vegetarian for years and eats healthy and exercises daily with yoga use also. Seeks holistic care if needed. Screening labs desired. No health issues today.  No LMP recorded. Patient is postmenopausal.  Menopausal in 2012.         Sexually active: No.  The current method of family planning is post menopausal status.    Exercising: Yes.    hot yoga, walk, rebound, light weights Smoker:  no  Health Maintenance: Pap: unsure maybe 2010 with [place] History of Abnormal Pap: yes with LEEP 4-5 years ago MMG:  2007, declined, but had 2 thermogram in past 44yrs Self Breast exams: occ Colonoscopy:  none BMD:   none TDaP:  Unsure, declines Shingles: declined Pneumonia: declined Hep C and HIV: not done Labs: if needed   reports that she has quit smoking. She has never used smokeless tobacco. She reports that she drinks about 2.4 oz of alcohol per week.  Past Medical History:  Diagnosis Date  . Abnormal Pap smear of cervix   . Brain injury [facility]) 2007   attempted suicide  . Depression   . Thyroid disease     Past Surgical History:  Procedure Laterality Date  . AUGMENTATION MAMMAPLASTY    . CERVICAL BIOPSY  W/ LOOP ELECTRODE EXCISION    . DILATION AND CURETTAGE OF UTERUS    . HERNIA REPAIR      Current Outpatient Medications  Medication Sig Dispense Refill  . Cholecalciferol (VITAMIN D PO) Take 2,000 Int'l Units by mouth.    . COCONUT OIL PO Take by mouth.    . Glucosamine HCl-MSM (GLUCOSAMINE-MSM PO) Take by  mouth.    . IODINE, KELP, PO Take by mouth.    . Multiple Vitamins-Minerals (MULTIVITAMIN PO) Take by mouth.    . Omega-3 Fatty Acids (FISH OIL PO) Take by mouth.    . Probiotic Product (PROBIOTIC PO) Take by mouth.    Marland Kitchen UNABLE TO FIND cbd oil    . UNABLE TO FIND MCT oil     No current facility-administered medications for this visit.     Family History  Problem Relation Age of Onset  . Breast cancer Mother     ROS:  Pertinent items are noted in HPI.  Otherwise, a comprehensive ROS was negative.  Exam:   BP 116/70   Pulse 70   Resp 16   Ht 5' 6.25" (1.683 m)   Wt 133 lb (60.3 kg)   BMI 21.31 kg/m  Height: 5' 6.25" (168.3 cm) Ht Readings from Last 3 Encounters:  01/21/18 5' 6.25" (1.683 m)    General appearance: alert, cooperative and appears stated age Head: Normocephalic, without obvious abnormality, atraumatic Neck: no adenopathy, supple, symmetrical, trachea midline and thyroid normal to inspection and palpation Lungs: clear to auscultation bilaterally Breasts: normal appearance, no masses or tenderness, No nipple retraction or dimpling, No nipple discharge or bleeding, No axillary or supraclavicular adenopathy, Saline implants noted intact bilateral Heart: regular rate and rhythm Abdomen: soft, non-tender; no masses,  no organomegaly Extremities: extremities  normal, atraumatic, no cyanosis or edema Skin: Skin color, texture, turgor normal. No rashes or lesions Lymph nodes: Cervical, supraclavicular, and axillary nodes normal. No abnormal inguinal nodes palpated Neurologic: Grossly normal   Pelvic: External genitalia:  no lesions              Urethra:  normal appearing urethra with no masses, tenderness or lesions              Bartholin's and Skene's: normal                 Vagina: normal appearing vagina with normal color and discharge, no lesions              Cervix: multiparous appearance, no cervical motion tenderness and no lesions              Pap taken: Yes.    Bimanual Exam:  Uterus:  normal size, contour, position, consistency, mobility, non-tender and anteflexed              Adnexa: normal adnexa and no mass, fullness, tenderness               Rectovaginal: Confirms               Anus:  normal sphincter tone, no lesions  Chaperone present: yes  A:  Well Woman with normal exam  Menopausal no HRT  History of abnormal pap smear with LEEP  Years ago  History of thyroid disease none now  History of traumatic brain injury with suicide attempt, recovery good  Holistic care preferred  Mammogram due, declines will do thermogram every 2 years  Colonoscopy declined   P:   Reviewed health and wellness pertinent to exam  Discussed need to advise if vaginal bleeding  Discussed recovery efforts after health issues and feel she is in good health and 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 Patient prefers and will not have xrays if needed.  Discussed risks/benefits of colonoscopy and also cologard. Given information to check benefits for cologard, will call if desires to go forward with screening.  Screening labs: CMP,Lipid panel, TSH, Vitamin D, CBC, Hep C  Pap smear: yes   counseled on breast self exam, mammography screening, feminine hygiene, adequate intake of calcium and vitamin D, diet and exercise  return annually or prn  An After Visit Summary was printed and given to the patient.

## 2018-01-21 NOTE — Patient Instructions (Signed)

## 2018-01-22 LAB — COMPREHENSIVE METABOLIC PANEL
ALBUMIN: 4.9 g/dL (ref 3.5–5.5)
ALT: 15 IU/L (ref 0–32)
AST: 21 IU/L (ref 0–40)
Albumin/Globulin Ratio: 1.9 (ref 1.2–2.2)
Alkaline Phosphatase: 68 IU/L (ref 39–117)
BILIRUBIN TOTAL: 0.3 mg/dL (ref 0.0–1.2)
BUN / CREAT RATIO: 15 (ref 9–23)
BUN: 12 mg/dL (ref 6–24)
CHLORIDE: 104 mmol/L (ref 96–106)
CO2: 25 mmol/L (ref 20–29)
Calcium: 10 mg/dL (ref 8.7–10.2)
Creatinine, Ser: 0.79 mg/dL (ref 0.57–1.00)
GFR calc Af Amer: 97 mL/min/{1.73_m2} (ref >59)
GFR calc non Af Amer: 85 mL/min/{1.73_m2} (ref >59)
GLOBULIN, TOTAL: 2.6 g/dL (ref 1.5–4.5)
Glucose: 83 mg/dL (ref 65–99)
POTASSIUM: 4.7 mmol/L (ref 3.5–5.2)
SODIUM: 144 mmol/L (ref 134–144)
Total Protein: 7.5 g/dL (ref 6.0–8.5)

## 2018-01-22 LAB — HEPATITIS C ANTIBODY: HEP C VIRUS AB: 0.1 {s_co_ratio} (ref 0.0–0.9)

## 2018-01-22 LAB — CBC
Hematocrit: 39.5 % (ref 34.0–46.6)
Hemoglobin: 13.1 g/dL (ref 11.1–15.9)
MCH: 29.6 pg (ref 26.6–33.0)
MCHC: 33.2 g/dL (ref 31.5–35.7)
MCV: 89 fL (ref 79–97)
PLATELETS: 344 10*3/uL (ref 150–379)
RBC: 4.42 x10E6/uL (ref 3.77–5.28)
RDW: 13.4 % (ref 12.3–15.4)
WBC: 6.4 10*3/uL (ref 3.4–10.8)

## 2018-01-22 LAB — LIPID PANEL
CHOLESTEROL TOTAL: 160 mg/dL (ref 100–199)
Chol/HDL Ratio: 2.7 ratio (ref 0.0–4.4)
HDL: 59 mg/dL (ref >39)
LDL CALC: 86 mg/dL (ref 0–99)
Triglycerides: 77 mg/dL (ref 0–149)
VLDL CHOLESTEROL CAL: 15 mg/dL (ref 5–40)

## 2018-01-22 LAB — TSH: TSH: 8.54 u[IU]/mL — AB (ref 0.450–4.500)

## 2018-01-22 LAB — VITAMIN D 25 HYDROXY (VIT D DEFICIENCY, FRACTURES): Vit D, 25-Hydroxy: 41.1 ng/mL (ref 30.0–100.0)

## 2018-01-25 ENCOUNTER — Other Ambulatory Visit: Payer: Self-pay | Admitting: Certified Nurse Midwife

## 2018-01-25 DIAGNOSIS — R6889 Other general symptoms and signs: Secondary | ICD-10-CM

## 2018-01-28 LAB — CYTOLOGY - PAP
DIAGNOSIS: NEGATIVE
HPV (WINDOPATH): NOT DETECTED

## 2018-02-18 ENCOUNTER — Other Ambulatory Visit (INDEPENDENT_AMBULATORY_CARE_PROVIDER_SITE_OTHER): Payer: BC Managed Care – PPO

## 2018-02-18 DIAGNOSIS — R6889 Other general symptoms and signs: Secondary | ICD-10-CM

## 2018-02-19 ENCOUNTER — Telehealth: Payer: Self-pay

## 2018-02-19 LAB — THYROID PANEL WITH TSH
FREE THYROXINE INDEX: 1.1 — AB (ref 1.2–4.9)
T3 Uptake Ratio: 25 % (ref 24–39)
T4, Total: 4.3 ug/dL — ABNORMAL LOW (ref 4.5–12.0)
TSH: 9.88 u[IU]/mL — ABNORMAL HIGH (ref 0.450–4.500)

## 2018-02-19 NOTE — Telephone Encounter (Signed)
Spoke with patient. Advised of results as seen below from [employer] CNM. Patient verbalizes understanding. Patient declines any further treatment or evaluation for thyroid. Advised of importance and all side effects that are potential with abnormal thyroid level. Patient again declines any further evaluation. Advised will notify Lupita Rosales CNM of denial. Patient is agreeable.  Routing to provider for final review.

## 2018-02-19 NOTE — Telephone Encounter (Signed)
-----   Message from Verner Chol, CNM sent at 02/19/2018 10:01 AM EDT ----- Notify patient that her TSH is elevated, increase from 4 weeks ago and T4 is low which indicates Primary Hypothyroid. She needs to be on medication for thyroid . This can affect changes with temperature regulation, hair, skin, nails and heart. She indicated when I saw her for last visit that she had history of thyroid issues but chose not to treat. If this is the case she needs to consider treatment  Or  referral to Endocrine for evaluation.

## 2018-02-22 NOTE — Telephone Encounter (Signed)
She discussed she would not take medication but yet wanted lab done. I have her in my reminder que to call in 2 weeks to discuss again.

## 2018-04-01 ENCOUNTER — Telehealth: Payer: Self-pay | Admitting: Certified Nurse Midwife

## 2018-04-01 NOTE — Telephone Encounter (Signed)
Patient had been called regarding elevated TSH lab and need for repeat and declined follow up.  Call placed to discuss with patient again

## 2018-04-02 ENCOUNTER — Other Ambulatory Visit: Payer: Self-pay | Admitting: Certified Nurse Midwife

## 2018-04-02 DIAGNOSIS — R6889 Other general symptoms and signs: Secondary | ICD-10-CM

## 2018-04-02 NOTE — Progress Notes (Signed)
Returning my call regarding elevated lab levels of Thyroid and panel indicating hypothyroid. Patient thought the Triage nurse said they were decreasing and she was not worried, because she was taking iodine supplement. Reviewed labs results with patient which was elevated . Questions addressed. Discussed repeating lab after she has be off Iodine for one week. Patient agreeable. Patient scheduled while on phone, orders placed.

## 2018-04-14 ENCOUNTER — Other Ambulatory Visit (INDEPENDENT_AMBULATORY_CARE_PROVIDER_SITE_OTHER): Payer: BC Managed Care – PPO

## 2018-04-14 DIAGNOSIS — R6889 Other general symptoms and signs: Secondary | ICD-10-CM

## 2018-04-15 LAB — THYROID PANEL WITH TSH
Free Thyroxine Index: 1.3 (ref 1.2–4.9)
T3 UPTAKE RATIO: 26 % (ref 24–39)
T4 TOTAL: 5 ug/dL (ref 4.5–12.0)
TSH: 5.84 u[IU]/mL — AB (ref 0.450–4.500)

## 2018-04-16 ENCOUNTER — Other Ambulatory Visit: Payer: Self-pay | Admitting: Certified Nurse Midwife

## 2018-04-16 DIAGNOSIS — R899 Unspecified abnormal finding in specimens from other organs, systems and tissues: Secondary | ICD-10-CM

## 2018-05-21 ENCOUNTER — Other Ambulatory Visit: Payer: Self-pay | Admitting: Certified Nurse Midwife

## 2018-06-18 ENCOUNTER — Telehealth: Payer: Self-pay

## 2018-06-18 ENCOUNTER — Other Ambulatory Visit: Payer: Self-pay | Admitting: Certified Nurse Midwife

## 2018-06-18 DIAGNOSIS — R899 Unspecified abnormal finding in specimens from other organs, systems and tissues: Secondary | ICD-10-CM

## 2018-06-18 NOTE — Telephone Encounter (Signed)
Pt scheduled for Wednesday 06-23-18 at 3pm for thyroid labwork recheck. It looks like order may be expired. Please extend lab order. Routing to [employer]orah Leonard, CNM.

## 2018-06-18 NOTE — Telephone Encounter (Signed)
done

## 2018-06-23 ENCOUNTER — Other Ambulatory Visit (INDEPENDENT_AMBULATORY_CARE_PROVIDER_SITE_OTHER): Payer: BC Managed Care – PPO

## 2018-06-23 DIAGNOSIS — R899 Unspecified abnormal finding in specimens from other organs, systems and tissues: Secondary | ICD-10-CM

## 2018-06-24 ENCOUNTER — Other Ambulatory Visit: Payer: Self-pay | Admitting: *Deleted

## 2018-06-24 DIAGNOSIS — R7989 Other specified abnormal findings of blood chemistry: Secondary | ICD-10-CM

## 2018-06-24 LAB — THYROID PANEL WITH TSH
FREE THYROXINE INDEX: 1.2 (ref 1.2–4.9)
T3 Uptake Ratio: 24 % (ref 24–39)
T4, Total: 5.2 ug/dL (ref 4.5–12.0)
TSH: 8.2 u[IU]/mL — AB (ref 0.450–4.500)

## 2018-08-04 ENCOUNTER — Telehealth: Payer: Self-pay | Admitting: Certified Nurse Midwife

## 2018-08-04 DIAGNOSIS — R6889 Other general symptoms and signs: Secondary | ICD-10-CM

## 2018-08-04 NOTE — Telephone Encounter (Signed)
I have received an e-mail from [facility] stating the patient has cancelled and did not want to reschedule an appointment. Please see referral notes. Please advise on how to proceed with the referral.  Clotilde Dieter

## 2018-08-06 NOTE — Telephone Encounter (Signed)
Spoke with patient. Patient states she cancelled her appointment because she states she was advised no labs would be drawn at [facility]. Patient states "I feel fine, no symptoms. For years I had hypothyroid and now hyperthyroid. Im not inclined to persue it". "I don't need a a consult, I don't have anything to talk about or tell them, I just need labs done". "I know the specific test I need, Im not wasting my or their time or money". "Why can't I just get labs repeated?". Patient requesting labs: TSH, T3, T4, complete thyroid and TPO. Explained to patient the purpose of the referral to specialist to not only draw the labs but manage and treat them if abnormal. Would need a consult with provider first to establish care. Patient again declined referral. I advised patient I would update Leota Sauers, CNM and return call if any additional recommendations.   Routing to [employer], CNM

## 2018-08-06 NOTE — Telephone Encounter (Signed)
Please check with patient to see why she cancelled endocrine appointment. I have already discussed with her personally the importance of follow up with elevated thyroid labs. Thank you

## 2018-08-09 NOTE — Telephone Encounter (Signed)
She had complete thyroid panel, but no TPO. I am fine with repeating labs, but depending on results she may still need Endocrine management, and she needs to know this. We have discussed this previously.

## 2018-08-10 NOTE — Telephone Encounter (Signed)
One month

## 2018-08-10 NOTE — Telephone Encounter (Signed)
[name], CNM -can you confirm labs? Thyroid panel with TSH and TPO? and when patient should repeat them?

## 2018-08-11 ENCOUNTER — Other Ambulatory Visit: Payer: Self-pay | Admitting: Certified Nurse Midwife

## 2018-08-11 NOTE — Telephone Encounter (Signed)
Spoke with patient, advised as seen below per Leota Sauers, CNM. Last labs drawn 06/23/18. Lab appt scheduled for 10/31 at 3pm. Patient verbalizes understanding.   Thyroid panel and TPO, future lab orders pended.   Routing to [employer], CNM.

## 2018-08-11 NOTE — Telephone Encounter (Signed)
Left message to call Jezebel Pollet, RN at [facility] [phone].   

## 2018-08-12 NOTE — Telephone Encounter (Signed)
Orders signed.  Encounter closed.

## 2018-08-12 NOTE — Telephone Encounter (Signed)
I could not find orders to sign. Ordered correctly.

## 2018-08-18 ENCOUNTER — Ambulatory Visit: Payer: BC Managed Care – PPO | Admitting: Internal Medicine

## 2018-08-18 ENCOUNTER — Other Ambulatory Visit: Payer: BC Managed Care – PPO

## 2018-08-18 DIAGNOSIS — R6889 Other general symptoms and signs: Secondary | ICD-10-CM

## 2018-08-19 ENCOUNTER — Other Ambulatory Visit: Payer: Self-pay

## 2018-08-19 LAB — THYROID PANEL WITH TSH
FREE THYROXINE INDEX: 1.4 (ref 1.2–4.9)
T3 Uptake Ratio: 25 % (ref 24–39)
T4, Total: 5.7 ug/dL (ref 4.5–12.0)
TSH: 9.21 u[IU]/mL — AB (ref 0.450–4.500)

## 2018-08-19 LAB — THYROID PEROXIDASE ANTIBODY: THYROID PEROXIDASE ANTIBODY: 242 [IU]/mL — AB (ref 0–34)

## 2018-08-24 ENCOUNTER — Other Ambulatory Visit: Payer: Self-pay | Admitting: *Deleted

## 2018-08-24 DIAGNOSIS — R7989 Other specified abnormal findings of blood chemistry: Secondary | ICD-10-CM

## 2018-09-27 ENCOUNTER — Encounter: Payer: Self-pay | Admitting: Internal Medicine

## 2018-09-27 ENCOUNTER — Ambulatory Visit: Payer: BC Managed Care – PPO | Admitting: Internal Medicine

## 2018-09-27 VITALS — BP 110/70 | HR 97 | Ht 66.25 in | Wt 136.0 lb

## 2018-09-27 DIAGNOSIS — E038 Other specified hypothyroidism: Secondary | ICD-10-CM | POA: Diagnosis not present

## 2018-09-27 DIAGNOSIS — E063 Autoimmune thyroiditis: Secondary | ICD-10-CM

## 2018-09-27 LAB — TSH: TSH: 7.62 u[IU]/mL — ABNORMAL HIGH (ref 0.35–4.50)

## 2018-09-27 LAB — T4, FREE: FREE T4: 0.55 ng/dL — AB (ref 0.60–1.60)

## 2018-09-27 LAB — T3, FREE: T3, Free: 3.2 pg/mL (ref 2.3–4.2)

## 2018-09-27 NOTE — Progress Notes (Addendum)
Patient ID: [name], female   DOB: 1961-06-12, 57 y.o.   MRN: [id number]    HPI  [name] is a 57 y.o.-year-old female, referred by her PCP, [name], CNM, for management of Hashimoto's hypothyroidism.  Pt. has been dx with hypothyroidism in her 2s >> she is not on Levothyroxine (LT4). She took this most of her life, but stopped around 2008.   In her 30s, she remembers having a thyroid Bx (she does not remember details).  Approximately 1 month ago, she started: - Thyrotropin PMG -containing bovine thyroid extract  -Thyroxal - MVI with 20 mcg Selenium, [place], Thyroid bovine extract  She was on iodine supplements in the past >> stopped last Summer.  I reviewed pt's thyroid tests: Lab Results  Component Value Date   TSH 9.210 (H) 08/18/2018   TSH 8.200 (H) 06/23/2018   TSH 5.840 (H) 04/14/2018   TSH 9.880 (H) 02/18/2018   TSH 8.540 (H) 01/21/2018   Antithyroid antibodies were elevated: Component     Latest Ref Rng & Units 08/18/2018  Thyroperoxidase Ab SerPl-aCnc     0 - 34 IU/mL 242 (H)   Pt describes: - + weight gain - + Always: cold intolerance - + Always: Depression - + Always: Dry skin  She denies: - constipation - hair loss  Pt denies feeling nodules in neck, hoarseness, dysphagia/odynophagia, SOB with lying down.  She has no FH of thyroid disorders. No FH of thyroid cancer.  No h/o radiation tx to head or neck. No recent use of iodine supplements.  Pt. also has a history of a brain injury in 2007 >> stopped LT4 in 2008 after her TFTs normalized after stopped smoking, cleaned diet, started to exercise.  ROS: Constitutional: + See HPI Eyes: no blurry vision, no xerophthalmia ENT: no sore throat, + see HPI Cardiovascular: no CP/SOB/palpitations/leg swelling Respiratory: no cough/SOB Gastrointestinal: no N/V/D/C Musculoskeletal: no muscle/joint aches Skin: no rashes Neurological: no  tremors/numbness/tingling/dizziness Psychiatric: + Depression/no anxiety  Past Medical History:  Diagnosis Date  . Abnormal Pap smear of cervix   . Brain injury [facility]) 2007   attempted suicide  . Depression   . Thyroid disease    Past Surgical History:  Procedure Laterality Date  . AUGMENTATION MAMMAPLASTY    . CERVICAL BIOPSY  W/ LOOP ELECTRODE EXCISION    . DILATION AND CURETTAGE OF UTERUS    . HERNIA REPAIR     Social History   Socioeconomic History  . Marital status: Single    Spouse name: Not on file  . Number of children: 2  . Years of education: Not on file  . Highest education level: Not on file  Occupational History  .  Communications specialist  Social Needs  . Financial resource strain: Not on file  . Food insecurity:    Worry: Not on file    Inability: Not on file  . Transportation needs:    Medical: Not on file    Non-medical: Not on file  Tobacco Use  . Smoking status: Former Games developer  . Smokeless tobacco: Never Used  Substance and Sexual Activity  . Alcohol use: no    Alcohol/week:     Types:   Marland Kitchen Drug use: Not Currently  . Sexual activity: Not Currently    Birth control/protection: Post-menopausal  Lifestyle  . Physical activity:    Days per week: 6    Minutes per session: Not on file  . Stress: Not on file  Relationships  . Social  connections:    Talks on phone: Not on file    Gets together: Not on file    Attends religious service: Not on file    Active member of club or organization: Not on file    Attends meetings of clubs or organizations: Not on file    Relationship status: Not on file  . Intimate partner violence:    Fear of current or ex partner: Not on file    Emotionally abused: Not on file    Physically abused: Not on file    Forced sexual activity: Not on file  Other Topics Concern  . Not on file  Social History Narrative  . Not on file   Current Outpatient Medications on File Prior to Visit  Medication Sig Dispense Refill   . Cholecalciferol (VITAMIN D PO) Take 2,000 Int'l Units by mouth.    . COCONUT OIL PO Take by mouth.    . Glucosamine HCl-MSM (GLUCOSAMINE-MSM PO) Take by mouth.    . IODINE, KELP, PO Take by mouth.    . Multiple Vitamins-Minerals (MULTIVITAMIN PO) Take by mouth.    . Omega-3 Fatty Acids (FISH OIL PO) Take by mouth.    . Probiotic Product (PROBIOTIC PO) Take by mouth.    Marland Kitchen. UNABLE TO FIND cbd oil    . UNABLE TO FIND MCT oil     No current facility-administered medications on file prior to visit.    No Known Allergies Family History  Problem Relation Age of Onset  . Breast cancer Mother     PE: BP 110/70   Pulse 97   Ht 5' 6.25" (1.683 m) Comment: measured  Wt 136 lb (61.7 kg)   SpO2 99%   BMI 21.79 kg/m  Wt Readings from Last 3 Encounters:  09/27/18 136 lb (61.7 kg)  01/21/18 133 lb (60.3 kg)   Constitutional: normal weight, in NAD Eyes: PERRLA, EOMI, no exophthalmos ENT: moist mucous membranes, no thyromegaly, no cervical lymphadenopathy Cardiovascular: RRR (no tachycardia at the time of the exam), No MRG Respiratory: CTA B Gastrointestinal: abdomen soft, NT, ND, BS+ Musculoskeletal: no deformities, strength intact in all 4 Skin: moist, warm, no rashes Neurological: no tremor with outstretched hands, DTR normal in all 4  ASSESSMENT: 1. Hypothyroidism  2. Hashimoto's thyroiditis  PLAN:  1. Patient with long-standing hypothyroidism, not on levothyroxine therapy.  She remembers having had abnormal thyroid test for almost her entire life and was previously on levothyroxine.  She stopped after her brain injury, around 2008.  Her TSH levels were normal, but they started to increase in the last year.  Her TPO antibodies were also positive for checked by PCP 1.5 months ago. - She appears euthyroid and does not appear to have a goiter, thyroid nodules, or neck compression symptoms - We reviewed together her thyroid tests.  At the beginning of the visit, she was adamant that  she does not want to start any thyroid hormones.  However, she is taking 2 supplements that contain bovine thyroid extract, which may have helped with her thyroid test.  She does not feel a physical difference.    -We discussed at this visit about possible consequences of untreated hypothyroidism, from immediate symptoms: Fatigue, weight gain, hair loss, constipation, cold intolerance, to long-term problems: CHF, hypertension, dementia, increased risk of thyroid cancer, etc. -We also discussed about the indications for treatment for patients that have subclinical hypothyroidism: TSH above 10, signs and symptoms consistent with hypothyroidism, or desire for pregnancy.  She does  not have any a of these 3.  We discussed that as of now, we can continue to follow her if the free hormones are low and TSH remains lower than 10, but, as of now, she is already on thyroid hormones from her supplements.  I advised her that I cannot directly advised her to use these for treatment, but I would like to check her tests today to see how they impacted her TFTs.  If the tests return abnormal, I would suggest that she stops them and then repeat the tests in approximately 6 weeks afterwards.  If the TFTs are still abnormal, we may need to start levothyroxine.  She does agree with this plan. - will check thyroid tests today: TSH, free T3 free T4 - I advised pt to join my chart and I will send her the results through there  - Otherwise, I will see her back in 4 months  2. Hashimoto's thyroiditis - we had a long discussion about her Hashimoto thyroiditis diagnosis. I explained that this is an autoimmune disorder, in which she develops antibodies against her own thyroid. The antibodies bind to the thyroid tissue and cause inflammation, and, eventually, destruction of the gland and hypothyroidism. We don't know how long this process can be, it can last from months to years.  - I also explained that thyroid enlargement especially at  the beginning of her Hashimoto thyroiditis course is not uncommon, and it has a waxing and waning character.  - We discussed about treatment for Hashimoto thyroiditis, which is actually limited to thyroid hormones in case her TFTs are abnormal.We also discussed about ways to improve her immune system (relaxation, diet, exercise, sleep) to reduce the Ab titer and, subsequently, the thyroid inflammation.  Component     Latest Ref Rng & Units 09/27/2018  TSH     0.35 - 4.50 uIU/mL 7.62 (H)  T4,Free(Direct)     0.60 - 1.60 ng/dL 1.61 (L)  Triiodothyronine,Free,Serum     2.3 - 4.2 pg/mL 3.2   TSH slightly better but still above target. Free T4 slightly low. For now, I would suggest to stop her thyroid supplements but start a Selenium supplement, 200 mcg daily (to decrease her thyroid Ab's) and will recheck her TFTs in 1 mo.At that time, if TFTs are still abnormal >> will need LT4 vs desiccated thyroid extract.  Carlus Pavlov, MD PhD [facility]

## 2018-09-27 NOTE — Patient Instructions (Addendum)
Please stop at the lab.  We may continue off thyroid medicines for now.  Look up: [email or web address]  Please come back for a follow-up appointment in 4 months.   Hypothyroidism Hypothyroidism is a disorder of the thyroid. The thyroid is a large gland that is located in the lower front of the neck. The thyroid releases hormones that control how the body works. With hypothyroidism, the thyroid does not make enough of these hormones. What are the causes? Causes of hypothyroidism may include:  Viral infections.  Pregnancy.  Your own defense system (immune system) attacking your thyroid.  Certain medicines.  Birth defects.  Past radiation treatments to your head or neck.  Past treatment with radioactive iodine.  Past surgical removal of part or all of your thyroid.  Problems with the gland that is located in the center of your brain (pituitary).  What are the signs or symptoms? Signs and symptoms of hypothyroidism may include:  Feeling as though you have no energy (lethargy).  Inability to tolerate cold.  Weight gain that is not explained by a change in diet or exercise habits.  Dry skin.  Coarse hair.  Menstrual irregularity.  Slowing of thought processes.  Constipation.  Sadness or depression.  How is this diagnosed? Your health care provider may diagnose hypothyroidism with blood tests and ultrasound tests. How is this treated? Hypothyroidism is treated with medicine that replaces the hormones that your body does not make. After you begin treatment, it may take several weeks for symptoms to go away. Follow these instructions at home:  Take medicines only as directed by your health care provider.  If you start taking any new medicines, tell your health care provider.  Keep all follow-up visits as directed by your health care provider. This is important. As your condition improves, your dosage needs may change. You will need to have blood tests regularly so that  your health care provider can watch your condition. Contact a health care provider if:  Your symptoms do not get better with treatment.  You are taking thyroid replacement medicine and: ? You sweat excessively. ? You have tremors. ? You feel anxious. ? You lose weight rapidly. ? You cannot tolerate heat. ? You have emotional swings. ? You have diarrhea. ? You feel weak. Get help right away if:  You develop chest pain.  You develop an irregular heartbeat.  You develop a rapid heartbeat. This information is not intended to replace advice given to you by your health care provider. Make sure you discuss any questions you have with your health care provider. Document Released: 10/06/2005 Document Revised: 03/13/2016 Document Reviewed: 02/21/2014 Elsevier Interactive Patient Education  2018 [employer]

## 2018-09-28 DIAGNOSIS — E063 Autoimmune thyroiditis: Principal | ICD-10-CM

## 2018-09-28 DIAGNOSIS — E038 Other specified hypothyroidism: Secondary | ICD-10-CM | POA: Insufficient documentation

## 2018-10-01 NOTE — Progress Notes (Signed)
Thank you for seeing this patient and the update on her care.

## 2019-02-04 ENCOUNTER — Ambulatory Visit: Payer: BC Managed Care – PPO | Admitting: Internal Medicine

## 2019-06-10 ENCOUNTER — Ambulatory Visit: Payer: BC Managed Care – PPO | Admitting: Internal Medicine

## 2019-12-29 ENCOUNTER — Ambulatory Visit: Payer: BC Managed Care – PPO | Attending: Family

## 2019-12-29 DIAGNOSIS — Z23 Encounter for immunization: Secondary | ICD-10-CM

## 2019-12-29 NOTE — Progress Notes (Signed)
   Covid-19 Vaccination Clinic  Name:  [name]    MRN: [id number] DOB: 03-26-1961  12/29/2019  [name] was observed post Covid-19 immunization for 15 minutes without incident. She was provided with Vaccine Information Sheet and instruction to access the V-Safe system.   [name] was instructed to call 911 with any severe reactions post vaccine: Marland Kitchen Difficulty breathing  . Swelling of face and throat  . A fast heartbeat  . A bad rash all over body  . Dizziness and weakness   Immunizations Administered    Name Date Dose VIS Date Route   Moderna COVID-19 Vaccine 12/29/2019  4:01 PM 0.5 mL 09/20/2019 Intramuscular   Manufacturer: Moderna   Lot: [id number]   NDC: 75198-242-99

## 2020-01-09 ENCOUNTER — Encounter: Payer: Self-pay | Admitting: Certified Nurse Midwife

## 2020-01-31 ENCOUNTER — Ambulatory Visit: Payer: BC Managed Care – PPO | Attending: Family

## 2020-01-31 DIAGNOSIS — Z23 Encounter for immunization: Secondary | ICD-10-CM

## 2020-01-31 NOTE — Progress Notes (Signed)
   Covid-19 Vaccination Clinic  Name:  [name]    MRN: [id number] DOB: 07-Nov-1960  01/31/2020  [name] was observed post Covid-19 immunization for 15 minutes without incident. She was provided with Vaccine Information Sheet and instruction to access the V-Safe system.   [name] was instructed to call 911 with any severe reactions post vaccine: Marland Kitchen Difficulty breathing  . Swelling of face and throat  . A fast heartbeat  . A bad rash all over body  . Dizziness and weakness   Immunizations Administered    Name Date Dose VIS Date Route   Moderna COVID-19 Vaccine 01/31/2020  3:50 PM 0.5 mL 09/20/2019 Intramuscular   Manufacturer: Moderna   Lot: [id number]   NDC: 59470-761-51

## 2020-09-25 ENCOUNTER — Ambulatory Visit: Payer: BC Managed Care – PPO | Attending: Internal Medicine

## 2020-09-25 DIAGNOSIS — Z23 Encounter for immunization: Secondary | ICD-10-CM

## 2020-09-25 NOTE — Progress Notes (Signed)
   Covid-19 Vaccination Clinic  Name:  ODELIA GRACIANO    MRN: [id number] DOB: 03/11/1961  09/25/2020  Ms. Junker was observed post Covid-19 immunization for 15 minutes without incident. She was provided with Vaccine Information Sheet and instruction to access the V-Safe system.   Ms. Ewer was instructed to call 911 with any severe reactions post vaccine: Marland Kitchen Difficulty breathing  . Swelling of face and throat  . A fast heartbeat  . A bad rash all over body  . Dizziness and weakness   Immunizations Administered    Name Date Dose VIS Date Route   Pfizer COVID-19 Vaccine 09/25/2020  2:23 PM 0.3 mL 08/08/2020 Intramuscular   Manufacturer: [employer], [employer]   Lot: [id number]   NDC: [address]-5

## 2022-02-04 ENCOUNTER — Ambulatory Visit: Payer: BC Managed Care – PPO | Admitting: Radiology

## 2022-02-04 ENCOUNTER — Encounter: Payer: Self-pay | Admitting: Radiology

## 2022-02-04 ENCOUNTER — Other Ambulatory Visit (HOSPITAL_COMMUNITY)
Admission: RE | Admit: 2022-02-04 | Discharge: 2022-02-04 | Disposition: A | Discharge status: Home / Self Care | Payer: BC Managed Care – PPO | Source: Ambulatory Visit | Attending: Radiology | Admitting: Radiology

## 2022-02-04 VITALS — BP 122/76 | Ht 66.0 in | Wt 138.0 lb

## 2022-02-04 DIAGNOSIS — Z01419 Encounter for gynecological examination (general) (routine) without abnormal findings: Secondary | ICD-10-CM | POA: Diagnosis not present

## 2022-02-04 DIAGNOSIS — Z13820 Encounter for screening for osteoporosis: Secondary | ICD-10-CM | POA: Diagnosis not present

## 2022-02-04 DIAGNOSIS — Z1211 Encounter for screening for malignant neoplasm of colon: Secondary | ICD-10-CM

## 2022-02-04 NOTE — Progress Notes (Signed)
? ?  [name] 06-30-1961 HQ:[id number] ? ? ?History: Postmenopausal 61 y.o. presents for annual exam. No gyn concerns. Hx of TBI and suicide attempt. Not currently on antidepressants. Declines any testing involving radiation, she has had breast thermography in the past. ? ? ?Gynecologic History ?Postmenopausal ?Last Pap: 2019. Results were: normal, hx of LEEP ?Last mammogram: never, thermography in the past ?Last colonoscopy: never ?HRT use: never ? ?Obstetric History ?OB History  ?Gravida Para Term Preterm AB Living  ?6       4 2   ?SAB IAB Ectopic Multiple Live Births  ?2 2        ?  ?# Outcome Date GA Lbr Len/2nd Weight Sex Delivery Anes PTL Lv  ?6 Gravida           ?5 Gravida           ?4 IAB           ?3 IAB           ?2 SAB           ?1 SAB           ? ? ? ?The following portions of the patient's history were reviewed and updated as appropriate: allergies, current medications, past family history, past medical history, past social history, past surgical history, and problem list. ? ?Review of Systems ?Pertinent items noted in HPI and remainder of comprehensive ROS otherwise negative.  ?Past medical history, past surgical history, family history and social history were all reviewed and documented in the EPIC chart. ? ?Exam: ? ?Vitals:  ? 02/04/22 1454  ?BP: 122/76  ?Weight: 138 lb (62.6 kg)  ?Height: 5\' 6"  (1.676 m)  ? ?Body mass index is 22.27 kg/m?. ? ?General appearance:  Normal ?Thyroid:  Symmetrical, normal in size, without palpable masses or nodularity. ?Respiratory ? Auscultation:  Clear without wheezing or rhonchi ?Cardiovascular ? Auscultation:  Regular rate, without rubs, murmurs or gallops ? Edema/varicosities:  Not grossly evident ?Abdominal ? Soft,nontender, without masses, guarding or rebound. ? Liver/spleen:  No organomegaly noted ? Hernia:  None appreciated ? Skin ? Inspection:  Grossly normal ?Breasts: Examined lying and sitting. Bilateral saline implants.  ? Right: Without masses,  retractions, nipple discharge or axillary adenopathy. ? ? Left: Without masses, retractions, nipple discharge or axillary adenopathy. ?Genitourinary  ? Inguinal/mons:  Normal without inguinal adenopathy ? External genitalia:  Normal appearing vulva with no masses, tenderness, or lesions ? BUS/Urethra/Skene's glands:  Normal ? Vagina:  Normal appearing with normal color and discharge, no lesions. Atrophy: mild  ? Cervix:  Normal appearing without discharge or lesions ? Uterus:  Normal in size, shape and contour.  Midline and mobile, nontender ? Adnexa/parametria:   ?  Rt: Normal in size, without masses or tenderness. ?  Lt: Normal in size, without masses or tenderness. ? Anus and perineum: Normal ?  ? ?Patient informed chaperone available to be present for breast and pelvic exam. Patient has requested no chaperone to be present. Patient has been advised what will be completed during breast and pelvic exam.  ? ?Assessment/Plan:   ?1. Well woman exam with routine gynecological exam ?Pap with cotesting ?Schedule mammogram ?Declines DEXA ? ?2. Colon cancer screening ?cologuard  ? ?Discussed SBE, colonoscopy and DEXA screening as directed. Recommend 174mins of exercise weekly, including weight bearing exercise. Encouraged the use of seatbelts and sunscreen.  ?Return in 1 year for annual or sooner prn. ? ?Kerry Dory WHNP-BC, 3:26 PM 02/04/2022  ?

## 2022-02-05 LAB — CYTOLOGY - PAP
Comment: NEGATIVE
Diagnosis: NEGATIVE
High risk HPV: NEGATIVE

## 2022-02-10 ENCOUNTER — Other Ambulatory Visit: Payer: Self-pay | Admitting: Radiology

## 2022-02-10 DIAGNOSIS — Z1231 Encounter for screening mammogram for malignant neoplasm of breast: Secondary | ICD-10-CM

## 2022-02-19 ENCOUNTER — Ambulatory Visit: Payer: BC Managed Care – PPO

## 2022-02-27 LAB — COLOGUARD: COLOGUARD: NEGATIVE

## 2022-03-10 ENCOUNTER — Encounter: Payer: Self-pay | Admitting: Radiology

## 2022-08-01 DIAGNOSIS — E039 Hypothyroidism, unspecified: Secondary | ICD-10-CM | POA: Diagnosis not present

## 2022-08-01 DIAGNOSIS — Z0000 Encounter for general adult medical examination without abnormal findings: Secondary | ICD-10-CM | POA: Diagnosis not present

## 2022-08-01 DIAGNOSIS — E7800 Pure hypercholesterolemia, unspecified: Secondary | ICD-10-CM | POA: Diagnosis not present

## 2023-02-03 DIAGNOSIS — E039 Hypothyroidism, unspecified: Secondary | ICD-10-CM | POA: Diagnosis not present

## 2023-08-04 DIAGNOSIS — E785 Hyperlipidemia, unspecified: Secondary | ICD-10-CM | POA: Diagnosis not present

## 2023-08-04 DIAGNOSIS — Z0000 Encounter for general adult medical examination without abnormal findings: Secondary | ICD-10-CM | POA: Diagnosis not present

## 2023-08-04 DIAGNOSIS — E559 Vitamin D deficiency, unspecified: Secondary | ICD-10-CM | POA: Diagnosis not present

## 2023-08-04 DIAGNOSIS — E039 Hypothyroidism, unspecified: Secondary | ICD-10-CM | POA: Diagnosis not present

## 2023-09-14 DIAGNOSIS — E039 Hypothyroidism, unspecified: Secondary | ICD-10-CM | POA: Diagnosis not present

## 2023-11-13 DIAGNOSIS — E039 Hypothyroidism, unspecified: Secondary | ICD-10-CM | POA: Diagnosis not present

## 2024-03-28 DIAGNOSIS — L237 Allergic contact dermatitis due to plants, except food: Secondary | ICD-10-CM | POA: Diagnosis not present

## 2024-08-03 DIAGNOSIS — R634 Abnormal weight loss: Secondary | ICD-10-CM | POA: Diagnosis not present

## 2024-08-03 DIAGNOSIS — E039 Hypothyroidism, unspecified: Secondary | ICD-10-CM | POA: Diagnosis not present

## 2024-08-03 DIAGNOSIS — E785 Hyperlipidemia, unspecified: Secondary | ICD-10-CM | POA: Diagnosis not present

## 2024-08-18 DIAGNOSIS — Z23 Encounter for immunization: Secondary | ICD-10-CM | POA: Diagnosis not present
